# Patient Record
Sex: Male | Born: 1980 | Race: White | Hispanic: No | Marital: Single
Health system: Southern US, Community
[De-identification: ages and names within clinical notes are randomized; demographics above are authoritative.]

---

## 2020-02-10 ENCOUNTER — Emergency Department (HOSPITAL_COMMUNITY): Payer: HRSA Program

## 2020-02-10 ENCOUNTER — Emergency Department (HOSPITAL_COMMUNITY)
Admission: EM | Admit: 2020-02-10 | Discharge: 2020-02-10 | Disposition: A | Discharge status: Home / Self Care | Payer: HRSA Program | Attending: Emergency Medicine | Admitting: Emergency Medicine

## 2020-02-10 ENCOUNTER — Other Ambulatory Visit: Payer: Self-pay

## 2020-02-10 ENCOUNTER — Encounter (HOSPITAL_COMMUNITY): Payer: Self-pay | Admitting: Emergency Medicine

## 2020-02-10 DIAGNOSIS — U071 COVID-19: Secondary | ICD-10-CM | POA: Insufficient documentation

## 2020-02-10 DIAGNOSIS — B349 Viral infection, unspecified: Secondary | ICD-10-CM | POA: Diagnosis present

## 2020-02-10 DIAGNOSIS — R0789 Other chest pain: Secondary | ICD-10-CM | POA: Diagnosis present

## 2020-02-10 DIAGNOSIS — R079 Chest pain, unspecified: Secondary | ICD-10-CM | POA: Diagnosis present

## 2020-02-10 LAB — BASIC METABOLIC PANEL
Anion gap: 10 (ref 5–15)
BUN: 12 mg/dL (ref 6–20)
CO2: 25 mmol/L (ref 22–32)
Calcium: 8.9 mg/dL (ref 8.9–10.3)
Chloride: 103 mmol/L (ref 98–111)
Creatinine, Ser: 0.88 mg/dL (ref 0.61–1.24)
GFR, Estimated: >60 mL/min (ref >60)
Glucose, Bld: 89 mg/dL (ref 70–99)
Potassium: 3.7 mmol/L (ref 3.5–5.1)
Sodium: 138 mmol/L (ref 135–145)

## 2020-02-10 LAB — RESPIRATORY PANEL BY RT PCR (FLU A&B, COVID)
Influenza A by PCR: NEGATIVE
Influenza B by PCR: NEGATIVE
SARS Coronavirus 2 by RT PCR: POSITIVE — AB

## 2020-02-10 LAB — TROPONIN I (HIGH SENSITIVITY): Troponin I (High Sensitivity): 4 ng/L (ref <18)

## 2020-02-10 LAB — CBC
HCT: 48.2 % (ref 39.0–52.0)
Hemoglobin: 16.6 g/dL (ref 13.0–17.0)
MCH: 31.2 pg (ref 26.0–34.0)
MCHC: 34.4 g/dL (ref 30.0–36.0)
MCV: 90.6 fL (ref 80.0–100.0)
Platelets: 243 10*3/uL (ref 150–400)
RBC: 5.32 MIL/uL (ref 4.22–5.81)
RDW: 12 % (ref 11.5–15.5)
WBC: 6.7 10*3/uL (ref 4.0–10.5)
nRBC: 0 % (ref 0.0–0.2)

## 2020-02-10 NOTE — ED Provider Notes (Signed)
I saw and evaluated the patient, reviewed the resident's note and I agree with the findings and plan.  EKG: EKG Interpretation  Date/Time:  Saturday February 10 2020 15:26:34 EDT Ventricular Rate:  91 PR Interval:    QRS Duration: 96 QT Interval:  347 QTC Calculation: 427 R Axis:   -27 Text Interpretation: Sinus rhythm Borderline left axis deviation RSR' in V1 or V2, probably normal variant ST elev, probable normal early repol pattern 12 Lead; Mason-Likar No old tracing to compare Confirmed by Lorre Nick ([id number]) on 02/10/2020 6:65:73 PM  39 year old male who presents with atypical chest pain x2 days.  Has had a fever and cough.  He is on exam.  Labs and chest x-ray are reassuring.  Heart score 1.  Will send Covid swab.   Lorre Nick, MD 02/10/20 1850

## 2020-02-10 NOTE — ED Provider Notes (Signed)
[facility]-EMERGENCY DEPT Provider Note   CSN: [id number] Arrival date & time: 02/10/20  1457     History Chief Complaint  Patient presents with  . Chest Pain    Blake Wright is a 39 y.o. male with past medical history of hypertension who presents the ED for 2 days of fever, chest pain or shortness of breath.  His chest pain started at rest yesterday, located midsternum and radiates to his throat, worse with breathing but not with movement, nothing makes it better.  He states that he cannot take a full deep breaths due to the pain.  He also have some coughing and body aches but denies loss of taste, loss of smell, abdominal pain or diarrhea.  He denies sick contacts or Covid exposure.  He is not vaccinated against Covid.  HPI     History reviewed. No pertinent past medical history.  Patient Active Problem List   Diagnosis Date Noted  . Viral infection 02/10/2020  . Atypical chest pain 02/10/2020    History reviewed. No pertinent surgical history.     No family history on file.  Social History   Tobacco Use  . Smoking status: Not on file  Substance Use Topics  . Alcohol use: Not on file  . Drug use: Not on file    Home Medications Prior to Admission medications   Not on File    Allergies    Patient has no known allergies.  Review of Systems   Review of Systems  Constitutional: Positive for fever.  HENT:       No loss of taste or smell  Respiratory: Positive for cough, chest tightness and shortness of breath.   Cardiovascular: Positive for chest pain.  Gastrointestinal: Negative for abdominal pain, diarrhea, nausea and vomiting.  Musculoskeletal: Positive for myalgias.    Physical Exam Updated Vital Signs BP 136/90 (BP Location: Left Arm)   Pulse 98   Temp 99.2 F (37.3 C) (Oral)   Resp 16   SpO2 99%   Physical Exam Constitutional:      General: He is not in acute distress.    Appearance: He is normal weight.  HENT:      Head: Normocephalic.  Eyes:     General:        Right eye: No discharge.        Left eye: No discharge.     Conjunctiva/sclera: Conjunctivae normal.  Cardiovascular:     Rate and Rhythm: Normal rate and regular rhythm.     Heart sounds: Normal heart sounds.  Pulmonary:     Effort: No respiratory distress.     Breath sounds: Normal breath sounds. No wheezing.  Abdominal:     General: Bowel sounds are normal.     Palpations: Abdomen is soft.  Musculoskeletal:     Cervical back: Normal range of motion.     Right lower leg: No edema.     Left lower leg: No edema.     Comments: No pain to palpation at sternum  Skin:    General: Skin is warm.     Coloration: Skin is not jaundiced.  Neurological:     General: No focal deficit present.     Mental Status: He is alert.  Psychiatric:        Mood and Affect: Mood normal.     ED Results / Procedures / Treatments   Labs (all labs ordered are listed, but only abnormal results are displayed) Labs Reviewed  RESPIRATORY  PANEL BY RT PCR (FLU A&B, COVID)  BASIC METABOLIC PANEL  CBC  TROPONIN I (HIGH SENSITIVITY)  TROPONIN I (HIGH SENSITIVITY)    EKG EKG Interpretation  Date/Time:  Saturday February 10 2020 15:26:34 EDT Ventricular Rate:  91 PR Interval:    QRS Duration: 96 QT Interval:  347 QTC Calculation: 427 R Axis:   -27 Text Interpretation: Sinus rhythm Borderline left axis deviation RSR' in V1 or V2, probably normal variant ST elev, probable normal early repol pattern 12 Lead; Mason-Likar No old tracing to compare Confirmed by Lorre Nick ([id number]) on 02/10/2020 6:36:46 PM   Radiology DG Chest 2 View  Result Date: 02/10/2020 CLINICAL DATA:  Chest pain. EXAM: CHEST - 2 VIEW COMPARISON:  None. FINDINGS: The heart size and mediastinal contours are within normal limits. Both lungs are clear. No pneumothorax or pleural effusion is noted. The visualized skeletal structures are unremarkable. IMPRESSION: No active cardiopulmonary  disease. Electronically Signed   By: Lupita Raider M.D.   On: 02/10/2020 16:15    Procedures Procedures (including critical care time)  Medications Ordered in ED Medications - No data to display  ED Course  I have reviewed the triage vital signs and the nursing notes.  Pertinent labs & imaging results that were available during my care of the patient were reviewed by me and considered in my medical decision making (see chart for details).    MDM Rules/Calculators/A&P                          Patient presents the ED for 2 days of fever, chest pain, shortness of breath.  He also endorses coughing and body aches.  He denies sick contact or Covid exposure.  His chest pain started at rest, worse with deep breathing and coughing, not worse with movement.  Low suspicion for ACS, PE, or aortic dissection.  His pain is likely musculoskeletal due to heavy lifting at work and coughing.  This is likely a viral upper respiratory tract infection.  Also tested for Covid PCR.  Will inform patient of the result.  Patient is instructed to take Tylenol as needed for fever and pain and treat symptoms with over-the-counter medications.  Come back for worsening chest pain shortness of breath.  Patient agrees with the plan.  Final Clinical Impression(s) / ED Diagnoses Final diagnoses:  Viral infection  Atypical chest pain    Rx / DC Orders ED Discharge Orders    None       Doran Stabler, DO 02/10/20 1914    Lorre Nick, MD 02/11/20 [phone]

## 2020-02-10 NOTE — ED Triage Notes (Signed)
Patient c/o fever x2 days and chest tightness with SOB this morning. Denies cough.

## 2020-02-10 NOTE — Discharge Instructions (Signed)
Blake Wright, you came to the ED for fever, chest pain shortness of breath.  This is likely a viral upper respiratory tract infection.  We will tested for Covid and call you of the results.  Your lab works and EKG rule out heart attack.  Please take Tylenol as needed for fever and pain.  Come back to the ED for worsening symptoms.  Take care

## 2020-02-11 ENCOUNTER — Telehealth (HOSPITAL_COMMUNITY): Payer: Self-pay

## 2020-02-11 ENCOUNTER — Telehealth: Payer: Self-pay | Admitting: Nurse Practitioner

## 2020-02-11 NOTE — Telephone Encounter (Signed)
Called to Discuss with patient about Covid symptoms and the use of the monoclonal antibody infusion for those with mild to moderate Covid symptoms and at a high risk of hospitalization.     Pt appears to qualify for this infusion due to co-morbid conditions and/or a member of an at-risk group in accordance with the FDA Emergency Use Authorization. BMI>25 and hypertension. Per ED notes symptom onset 02/08/20.    Unable to reach pt.Voicemail left.   Willette Alma, NP [facility]  [phone]

## 2020-02-11 NOTE — Telephone Encounter (Signed)
Called to discuss with patient about Covid symptoms and the use of casirivimab/imdevimab, a monoclonal antibody infusion for those with mild to moderate Covid symptoms and at a high risk of hospitalization.  Pt is qualified for this infusion at the [facility] due to; Specific high risk criteria : BMI > 25 and Cardiovascular disease or hypertension  Message left to call back our hotline [phone].  A MyChart message was also sent to the patient's MyChart account.  Wynne Dust, NP [facility]

## 2022-06-02 IMAGING — CR DG CHEST 2V
2 series · 2 of 2 positions shown · non-contrast
Comparison: None.

CLINICAL DATA: Chest pain.

EXAM:
CHEST - 2 VIEW

[w chest pa]
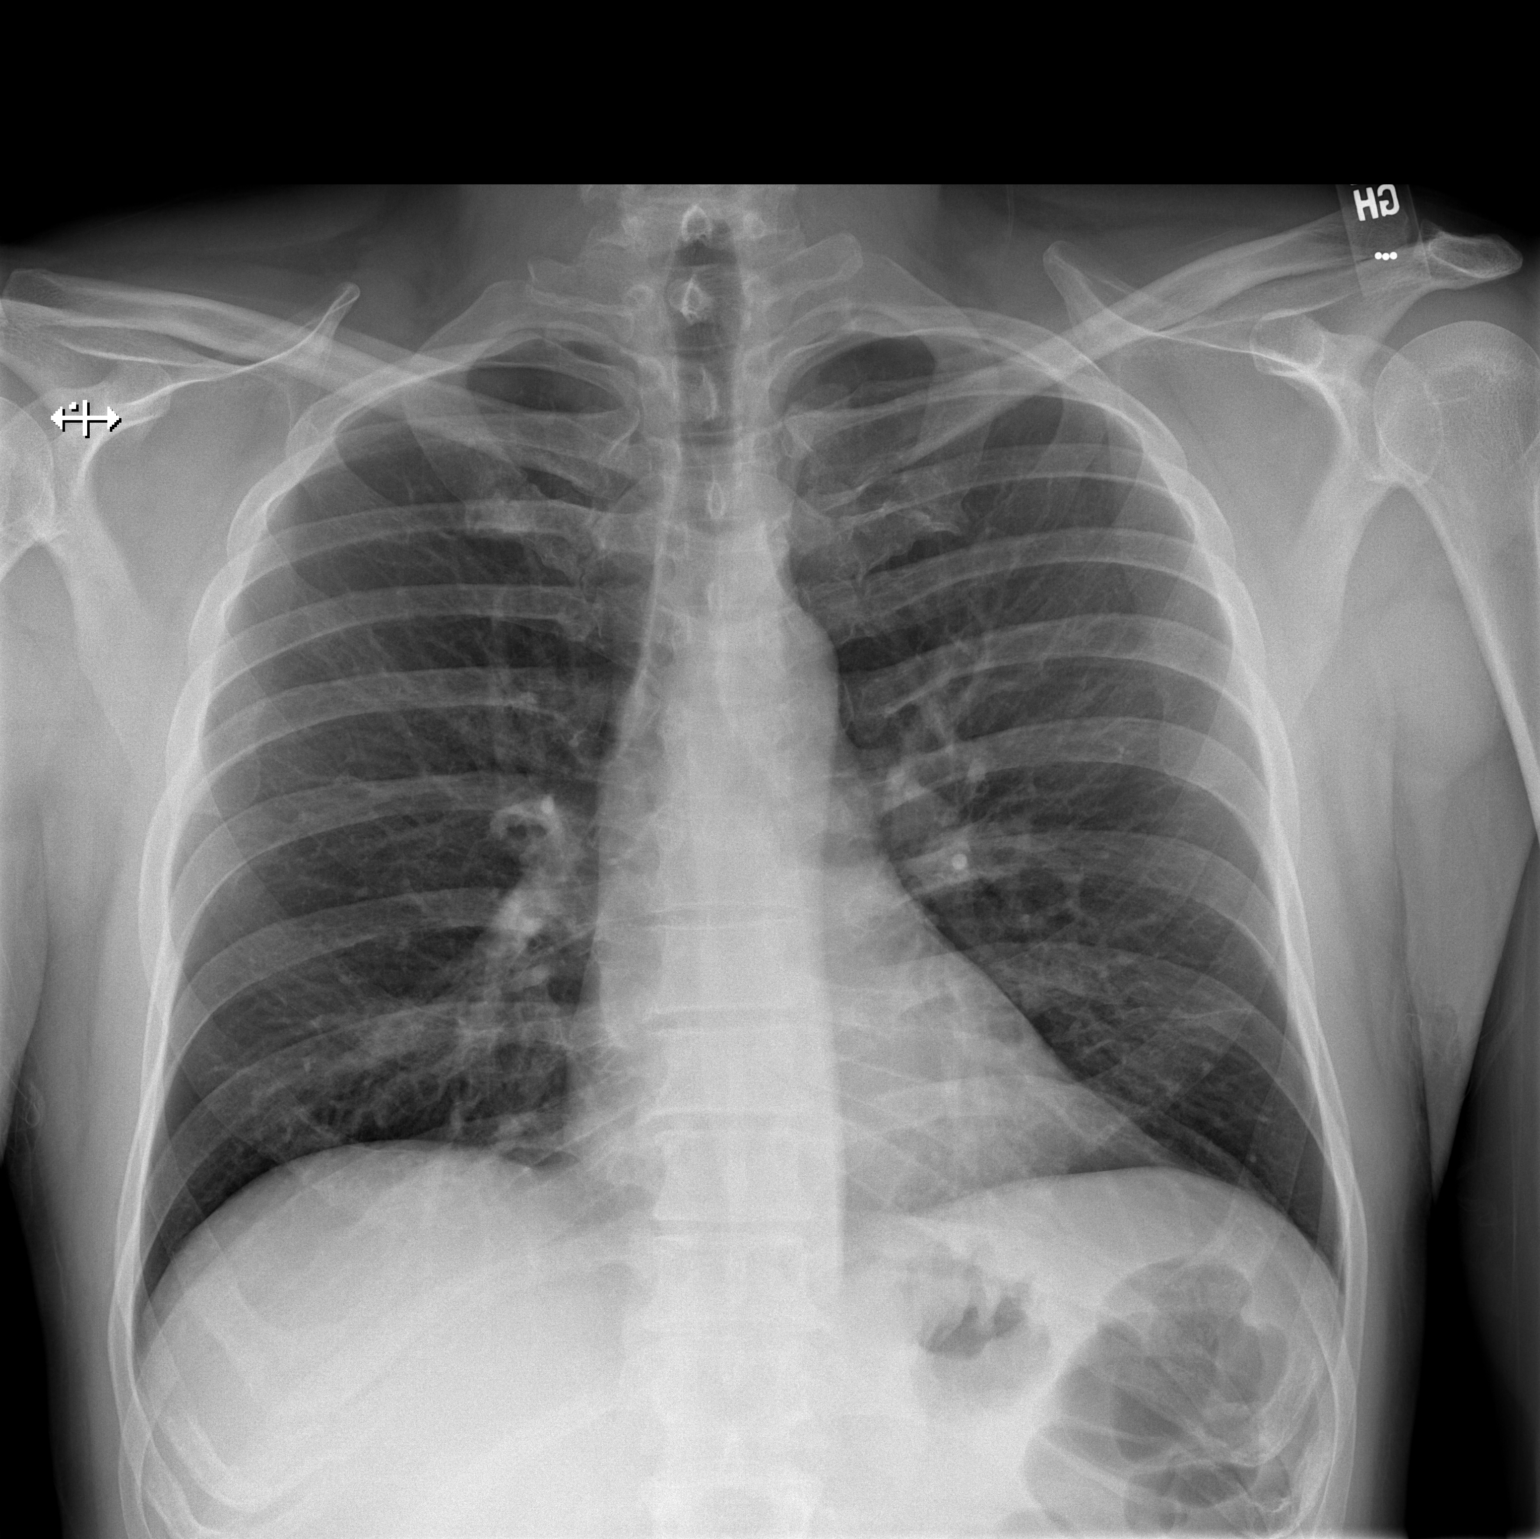

[w chest lat]
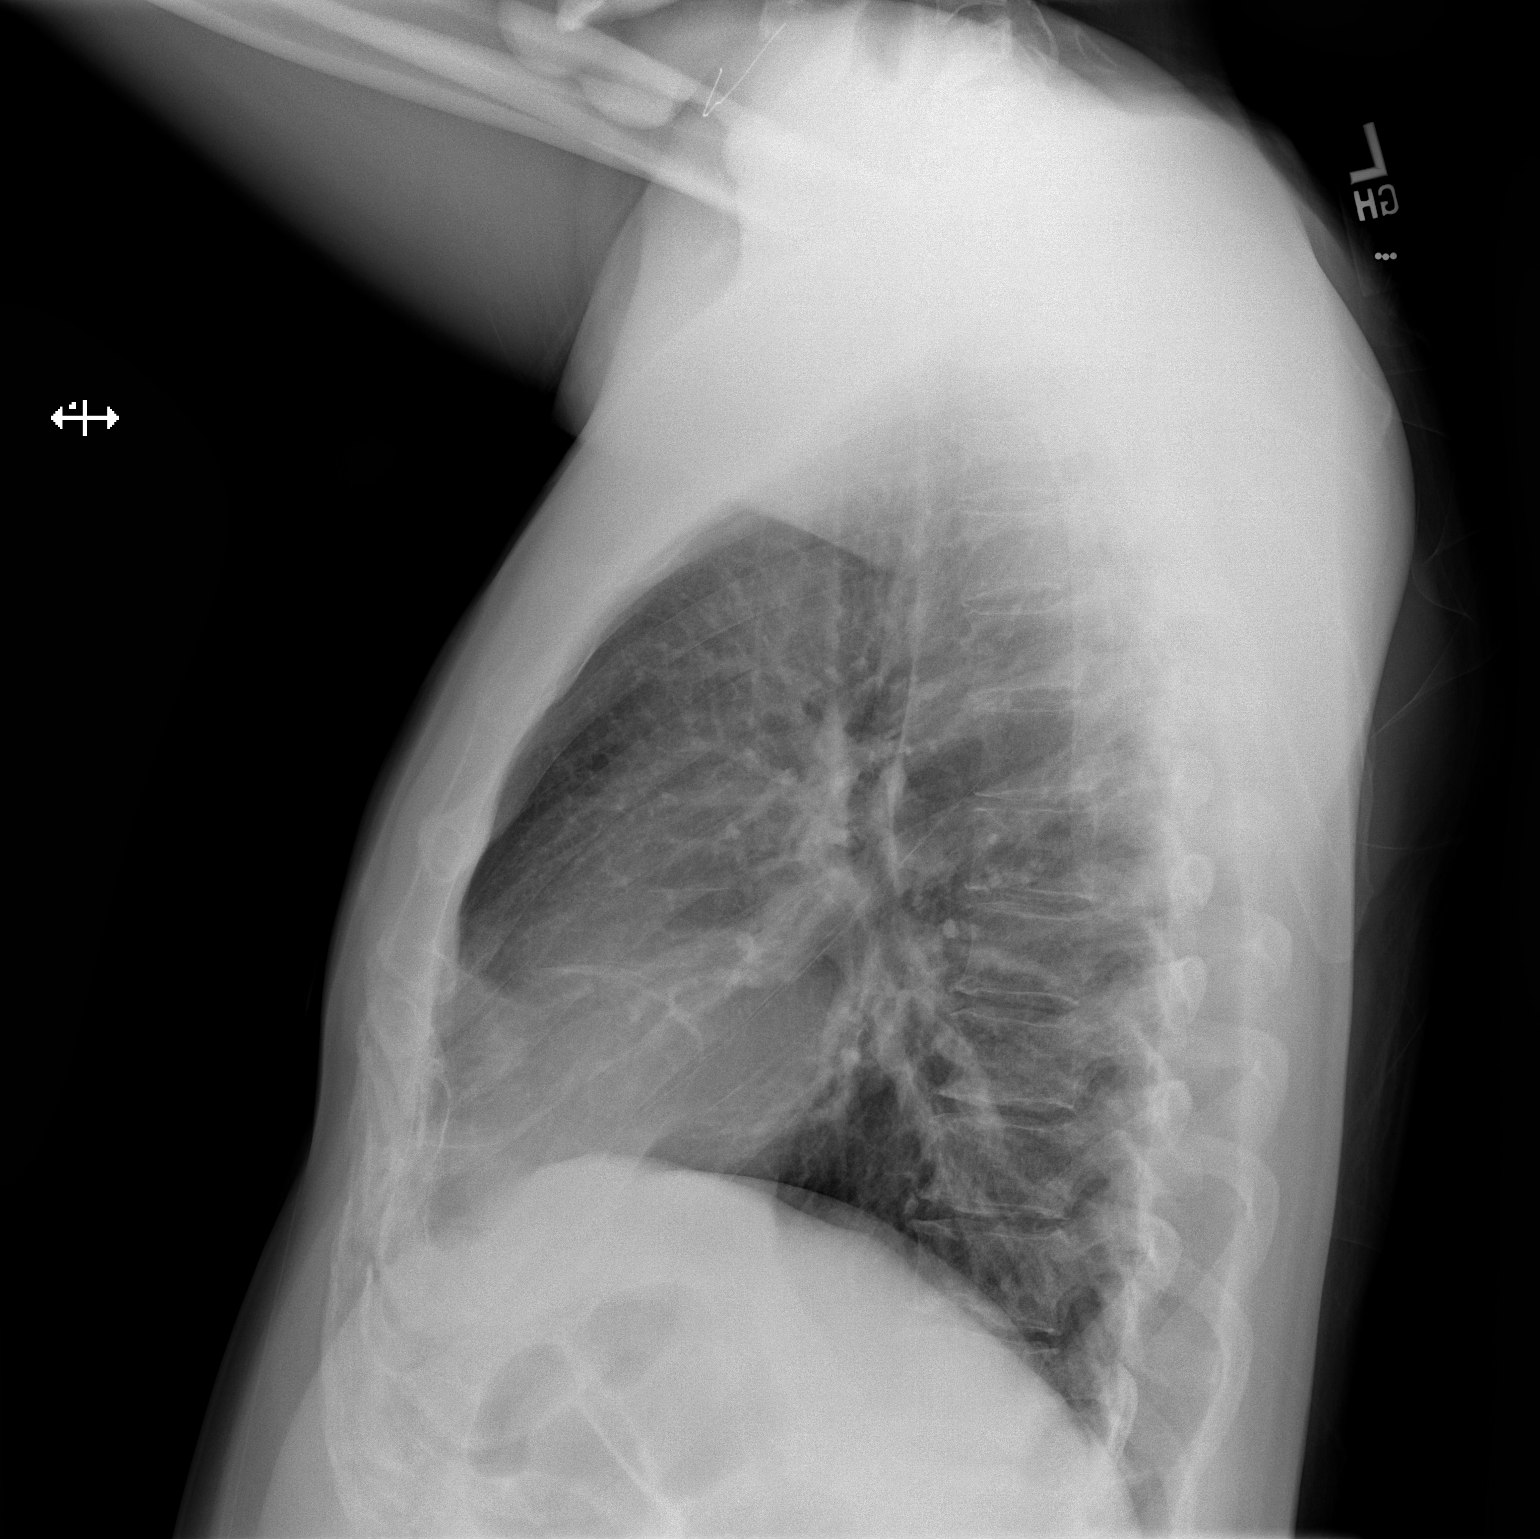

[2 of 2 positions shown; findings below may reference images not displayed]

FINDINGS: The heart size and mediastinal contours are within normal limits.
Both lungs are clear. No pneumothorax or pleural effusion is noted.
The visualized skeletal structures are unremarkable.
IMPRESSION: No active cardiopulmonary disease.
